# Patient Record
Sex: Female | Born: 1965 | Hispanic: No | Marital: Married | Smoking: Former smoker
Health system: Southern US, Community
[De-identification: ages and names within clinical notes are randomized; demographics above are authoritative.]

## PROBLEM LIST (undated history)

## (undated) DIAGNOSIS — F32A Depression, unspecified: Secondary | ICD-10-CM

## (undated) DIAGNOSIS — E039 Hypothyroidism, unspecified: Secondary | ICD-10-CM

## (undated) DIAGNOSIS — F329 Major depressive disorder, single episode, unspecified: Secondary | ICD-10-CM

## (undated) DIAGNOSIS — F419 Anxiety disorder, unspecified: Secondary | ICD-10-CM

## (undated) HISTORY — PX: TUBAL LIGATION: SHX77

## (undated) HISTORY — PX: OTHER SURGICAL HISTORY: SHX169

## (undated) HISTORY — PX: EYE SURGERY: SHX253

## (undated) HISTORY — PX: TONSILLECTOMY: SUR1361

---

## 1999-06-26 ENCOUNTER — Other Ambulatory Visit: Admission: RE | Admit: 1999-06-26 | Discharge: 1999-06-26 | Payer: Self-pay | Admitting: General Practice

## 2001-02-03 ENCOUNTER — Other Ambulatory Visit: Admission: RE | Admit: 2001-02-03 | Discharge: 2001-02-03 | Payer: Self-pay | Admitting: Gynecology

## 2013-05-16 DEATH — deceased

## 2018-01-15 ENCOUNTER — Other Ambulatory Visit: Payer: Self-pay | Admitting: Neurosurgery

## 2018-01-22 NOTE — Pre-Procedure Instructions (Signed)
[place] M[place]  01/22/2018      [id number] - [place], [place] - [address] AT [facility] [address] [place] [phone] Phone: [phone] Fax: [phone]    Your procedure is scheduled on 03/04/18.  [facility] Ad[facility] at 1030 A.M.  Call this number if you have problems the morning of surgery:  [phone]   Remember:  Do not eat or drink after midnight.      Take these medicines the morning of surgery with A SIP OF WATER --synthroid,claritin    Do not wear jewelry, make-up or nail polish.  Do not wear lotions, powders, or perfumes, or deodorant.  Do not shave 48 hours prior to surgery.  Men may shave face and neck.  Do not bring valuables to the hospital.  [facility] is not responsible for any belongings or valuables.  Contacts, dentures or bridgework may not be worn into surgery.  Leave your suitcase in the car.  After surgery it may be brought to your room.  For patients admitted to the hospital, discharge time will be determined by your treatment team.  Patients discharged the day of surgery will not be allowed to drive home.   Name and phone number of your driver:  Do not take any aspirin,anti-inflammatories,vitamins,or herbal supplements 5-7 days prior to surgery. Special instructions:  [place] - Preparing for Surgery  Before surgery, you can play an important role.  Because skin is not sterile, your skin needs to be as free of germs as possible.  You can reduce the number of germs on you skin by washing with CHG (chlorahexidine gluconate) soap before surgery.  CHG is an antiseptic cleaner which kills germs and bonds with the skin to continue killing germs even after washing.  Oral Hygiene is also important in reducing the risk of infection.  Remember to brush your teeth with your regular toothpaste the morning of surgery.  Please DO NOT use if you have an allergy to CHG or antibacterial  soaps.  If your skin becomes reddened/irritated stop using the CHG and inform your nurse when you arrive at Short Stay.  Do not shave (including legs and underarms) for at least 48 hours prior to the first CHG shower.  You may shave your face.  Please follow these instructions carefully:   1.  Shower with CHG Soap the night before surgery and the morning of Surgery.  2.  If you choose to wash your hair, wash your hair first as usual with your normal shampoo.  3.  After you shampoo, rinse your hair and body thoroughly to remove the shampoo. 4.  Use CHG as you would any other liquid soap.  You can apply chg directly to the skin and wash gently with a      scrungie or washcloth.           5.  Apply the CHG Soap to your body ONLY FROM THE NECK DOWN.   Do not use on open wounds or open sores. Avoid contact with your eyes, ears, mouth and genitals (private parts).  Wash genitals (private parts) with your normal soap.  6.  Wash thoroughly, paying special attention to the area where your surgery will be performed.  7.  Thoroughly rinse your body with warm water from the neck down.  8.  DO NOT shower/wash with your normal soap after using and rinsing off the CHG  Soap.  9.  Pat yourself dry with a clean towel.            10.  Wear clean pajamas.            11.  Place clean sheets on your bed the night of your first shower and do not sleep with pets.  Day of Surgery  Do not apply any lotions/deoderants the morning of surgery.   Please wear clean clothes to the hospital/surgery center. Remember to brush your teeth with toothpaste.    Please read over the following fact sheets that you were given.

## 2018-01-25 ENCOUNTER — Encounter (HOSPITAL_COMMUNITY)
Admission: RE | Admit: 2018-01-25 | Discharge: 2018-01-25 | Disposition: A | Discharge status: Home / Self Care | Payer: BLUE CROSS/BLUE SHIELD | Source: Ambulatory Visit | Attending: Neurosurgery | Admitting: Neurosurgery

## 2018-01-25 ENCOUNTER — Encounter (HOSPITAL_COMMUNITY): Payer: Self-pay | Admitting: *Deleted

## 2018-01-25 DIAGNOSIS — Z01812 Encounter for preprocedural laboratory examination: Secondary | ICD-10-CM | POA: Diagnosis not present

## 2018-01-25 HISTORY — DX: Depression, unspecified: F32.A

## 2018-01-25 HISTORY — DX: Major depressive disorder, single episode, unspecified: F32.9

## 2018-01-25 HISTORY — DX: Hypothyroidism, unspecified: E03.9

## 2018-01-25 HISTORY — DX: Anxiety disorder, unspecified: F41.9

## 2018-01-25 LAB — TYPE AND SCREEN
ABO/RH(D): O POS
Antibody Screen: NEGATIVE

## 2018-01-25 LAB — BASIC METABOLIC PANEL
ANION GAP: 11 (ref 5–15)
BUN: 14 mg/dL (ref 6–20)
CHLORIDE: 103 mmol/L (ref 98–111)
CO2: 22 mmol/L (ref 22–32)
Calcium: 9.4 mg/dL (ref 8.9–10.3)
Creatinine, Ser: 0.62 mg/dL (ref 0.44–1.00)
GFR calc Af Amer: >60 mL/min (ref >60)
GFR calc non Af Amer: >60 mL/min (ref >60)
Glucose, Bld: 93 mg/dL (ref 70–99)
POTASSIUM: 4.3 mmol/L (ref 3.5–5.1)
Sodium: 136 mmol/L (ref 135–145)

## 2018-01-25 LAB — SURGICAL PCR SCREEN
MRSA, PCR: NEGATIVE
Staphylococcus aureus: NEGATIVE

## 2018-01-25 LAB — CBC
HCT: 39.9 % (ref 36.0–46.0)
HEMOGLOBIN: 13.2 g/dL (ref 12.0–15.0)
MCH: 30.1 pg (ref 26.0–34.0)
MCHC: 33.1 g/dL (ref 30.0–36.0)
MCV: 90.9 fL (ref 78.0–100.0)
PLATELETS: 226 10*3/uL (ref 150–400)
RBC: 4.39 MIL/uL (ref 3.87–5.11)
RDW: 13.3 % (ref 11.5–15.5)
WBC: 8.9 10*3/uL (ref 4.0–10.5)

## 2018-01-25 LAB — ABO/RH: ABO/RH(D): O POS

## 2018-01-25 MED ORDER — CHLORHEXIDINE GLUCONATE CLOTH 2 % EX PADS: 6.0000 | MEDICATED_PAD | Freq: Once | CUTANEOUS | Status: DC

## 2018-02-01 ENCOUNTER — Inpatient Hospital Stay (HOSPITAL_COMMUNITY): Payer: BLUE CROSS/BLUE SHIELD | Admitting: Anesthesiology

## 2018-02-01 ENCOUNTER — Inpatient Hospital Stay (HOSPITAL_COMMUNITY): Payer: BLUE CROSS/BLUE SHIELD

## 2018-02-01 ENCOUNTER — Encounter (HOSPITAL_COMMUNITY): Payer: Self-pay | Admitting: Certified Registered Nurse Anesthetist

## 2018-02-01 ENCOUNTER — Other Ambulatory Visit: Payer: Self-pay

## 2018-02-01 ENCOUNTER — Inpatient Hospital Stay (HOSPITAL_COMMUNITY)
Admission: RE | Disposition: A | Discharge status: Home / Self Care | Payer: Self-pay | Source: Ambulatory Visit | Attending: Neurosurgery

## 2018-02-01 ENCOUNTER — Inpatient Hospital Stay (HOSPITAL_COMMUNITY)
Admission: RE | Admit: 2018-02-01 | Discharge: 2018-02-02 | DRG: 455 | Disposition: A | Discharge status: Home / Self Care | Payer: BLUE CROSS/BLUE SHIELD | Source: Ambulatory Visit | Attending: Neurosurgery | Admitting: Neurosurgery

## 2018-02-01 ENCOUNTER — Other Ambulatory Visit: Payer: Self-pay | Admitting: Neurosurgery

## 2018-02-01 DIAGNOSIS — Z79899 Other long term (current) drug therapy: Secondary | ICD-10-CM | POA: Diagnosis not present

## 2018-02-01 DIAGNOSIS — F329 Major depressive disorder, single episode, unspecified: Secondary | ICD-10-CM | POA: Diagnosis present

## 2018-02-01 DIAGNOSIS — M48062 Spinal stenosis, lumbar region with neurogenic claudication: Secondary | ICD-10-CM | POA: Diagnosis present

## 2018-02-01 DIAGNOSIS — M4316 Spondylolisthesis, lumbar region: Secondary | ICD-10-CM | POA: Diagnosis present

## 2018-02-01 DIAGNOSIS — Z419 Encounter for procedure for purposes other than remedying health state, unspecified: Secondary | ICD-10-CM

## 2018-02-01 DIAGNOSIS — Z87891 Personal history of nicotine dependence: Secondary | ICD-10-CM

## 2018-02-01 DIAGNOSIS — M47816 Spondylosis without myelopathy or radiculopathy, lumbar region: Secondary | ICD-10-CM | POA: Diagnosis present

## 2018-02-01 DIAGNOSIS — M5136 Other intervertebral disc degeneration, lumbar region: Secondary | ICD-10-CM | POA: Diagnosis present

## 2018-02-01 DIAGNOSIS — F419 Anxiety disorder, unspecified: Secondary | ICD-10-CM | POA: Diagnosis present

## 2018-02-01 DIAGNOSIS — M7130 Other bursal cyst, unspecified site: Secondary | ICD-10-CM | POA: Diagnosis present

## 2018-02-01 DIAGNOSIS — E039 Hypothyroidism, unspecified: Secondary | ICD-10-CM | POA: Diagnosis present

## 2018-02-01 SURGERY — POSTERIOR LUMBAR FUSION 1 LEVEL
Anesthesia: General | Site: Back

## 2018-02-01 MED ORDER — MIDAZOLAM HCL 2 MG/2ML IJ SOLN
INTRAMUSCULAR | Status: AC
  Filled 2018-02-01: qty 2

## 2018-02-01 MED ORDER — BUPIVACAINE HCL (PF) 0.5 % IJ SOLN
INTRAMUSCULAR | Status: AC
  Filled 2018-02-01: qty 30

## 2018-02-01 MED ORDER — ONDANSETRON HCL 4 MG/2ML IJ SOLN
INTRAMUSCULAR | Status: AC
  Filled 2018-02-01: qty 2

## 2018-02-01 MED ORDER — PAROXETINE HCL 20 MG PO TABS
20.0000 mg | ORAL_TABLET | Freq: Every evening | ORAL | Status: DC
  Filled 2018-02-01 (×3): qty 1

## 2018-02-01 MED ORDER — CEFAZOLIN SODIUM-DEXTROSE 2-4 GM/100ML-% IV SOLN
INTRAVENOUS | Status: AC
  Filled 2018-02-01: qty 100

## 2018-02-01 MED ORDER — THROMBIN (RECOMBINANT) 20000 UNITS EX SOLR
CUTANEOUS | Status: AC
  Filled 2018-02-01: qty 20000

## 2018-02-01 MED ORDER — HYDROMORPHONE HCL 1 MG/ML IJ SOLN
0.2500 mg | INTRAMUSCULAR | Status: DC | PRN
  Administered 2018-02-01: 0.5 mg via INTRAVENOUS

## 2018-02-01 MED ORDER — HYDROCODONE-ACETAMINOPHEN 5-325 MG PO TABS
1.0000 | ORAL_TABLET | ORAL | Status: DC | PRN
  Administered 2018-02-01: 2 via ORAL
  Administered 2018-02-02 (×2): 1 via ORAL
  Filled 2018-02-01: qty 1
  Filled 2018-02-01: qty 2
  Filled 2018-02-01: qty 1

## 2018-02-01 MED ORDER — FLEET ENEMA 7-19 GM/118ML RE ENEM: 1.0000 | ENEMA | Freq: Once | RECTAL | Status: DC | PRN

## 2018-02-01 MED ORDER — CEFAZOLIN SODIUM-DEXTROSE 2-4 GM/100ML-% IV SOLN
2.0000 g | INTRAVENOUS | Status: AC
  Administered 2018-02-01 (×2): 2 g via INTRAVENOUS

## 2018-02-01 MED ORDER — SODIUM CHLORIDE 0.9 % IV SOLN
INTRAVENOUS | Status: DC | PRN
  Administered 2018-02-01: 500 mL

## 2018-02-01 MED ORDER — LEVOTHYROXINE SODIUM 50 MCG PO TABS
50.0000 ug | ORAL_TABLET | Freq: Every day | ORAL | Status: DC
  Filled 2018-02-01: qty 1

## 2018-02-01 MED ORDER — KETOROLAC TROMETHAMINE 30 MG/ML IJ SOLN
30.0000 mg | Freq: Once | INTRAMUSCULAR | Status: AC
Start: 2018-02-01 — End: 2018-02-01
  Administered 2018-02-01: 30 mg via INTRAVENOUS

## 2018-02-01 MED ORDER — ONDANSETRON HCL 4 MG/2ML IJ SOLN: 4.0000 mg | Freq: Once | INTRAMUSCULAR | Status: DC | PRN

## 2018-02-01 MED ORDER — DEXAMETHASONE SODIUM PHOSPHATE 10 MG/ML IJ SOLN
INTRAMUSCULAR | Status: AC
  Filled 2018-02-01: qty 1

## 2018-02-01 MED ORDER — THROMBIN 5000 UNITS EX SOLR
CUTANEOUS | Status: AC
  Filled 2018-02-01: qty 5000

## 2018-02-01 MED ORDER — MEPERIDINE HCL 50 MG/ML IJ SOLN: 6.2500 mg | INTRAMUSCULAR | Status: DC | PRN

## 2018-02-01 MED ORDER — ROCURONIUM BROMIDE 10 MG/ML (PF) SYRINGE
PREFILLED_SYRINGE | INTRAVENOUS | Status: DC | PRN
  Administered 2018-02-01: 10 mg via INTRAVENOUS
  Administered 2018-02-01: 20 mg via INTRAVENOUS
  Administered 2018-02-01: 50 mg via INTRAVENOUS
  Administered 2018-02-01: 20 mg via INTRAVENOUS

## 2018-02-01 MED ORDER — FENTANYL CITRATE (PF) 250 MCG/5ML IJ SOLN
INTRAMUSCULAR | Status: AC
  Filled 2018-02-01: qty 5

## 2018-02-01 MED ORDER — MIDAZOLAM HCL 2 MG/2ML IJ SOLN
INTRAMUSCULAR | Status: DC | PRN
  Administered 2018-02-01: 2 mg via INTRAVENOUS

## 2018-02-01 MED ORDER — MORPHINE SULFATE (PF) 4 MG/ML IV SOLN: 4.0000 mg | INTRAVENOUS | Status: DC | PRN

## 2018-02-01 MED ORDER — KETOROLAC TROMETHAMINE 30 MG/ML IJ SOLN
INTRAMUSCULAR | Status: AC
  Filled 2018-02-01: qty 1

## 2018-02-01 MED ORDER — ARTIFICIAL TEARS OPHTHALMIC OINT
TOPICAL_OINTMENT | OPHTHALMIC | Status: AC
  Filled 2018-02-01: qty 3.5

## 2018-02-01 MED ORDER — ALUM & MAG HYDROXIDE-SIMETH 200-200-20 MG/5ML PO SUSP: 30.0000 mL | Freq: Four times a day (QID) | ORAL | Status: DC | PRN

## 2018-02-01 MED ORDER — ACETAMINOPHEN 325 MG PO TABS: 650.0000 mg | ORAL_TABLET | ORAL | Status: DC | PRN

## 2018-02-01 MED ORDER — ACETAMINOPHEN 650 MG RE SUPP: 650.0000 mg | RECTAL | Status: DC | PRN

## 2018-02-01 MED ORDER — ACETAMINOPHEN 10 MG/ML IV SOLN
INTRAVENOUS | Status: DC | PRN
  Administered 2018-02-01: 1000 mg via INTRAVENOUS

## 2018-02-01 MED ORDER — MENTHOL 3 MG MT LOZG: 1.0000 | LOZENGE | OROMUCOSAL | Status: DC | PRN

## 2018-02-01 MED ORDER — THROMBIN 5000 UNITS EX SOLR
OROMUCOSAL | Status: DC | PRN
  Administered 2018-02-01: 5 mL via TOPICAL

## 2018-02-01 MED ORDER — FENTANYL CITRATE (PF) 250 MCG/5ML IJ SOLN
INTRAMUSCULAR | Status: DC | PRN
  Administered 2018-02-01: 50 ug via INTRAVENOUS
  Administered 2018-02-01: 150 ug via INTRAVENOUS

## 2018-02-01 MED ORDER — ARTIFICIAL TEARS OPHTHALMIC OINT
TOPICAL_OINTMENT | OPHTHALMIC | Status: DC | PRN
  Administered 2018-02-01: 1 via OPHTHALMIC

## 2018-02-01 MED ORDER — SODIUM CHLORIDE 0.9 % IV SOLN: 250.0000 mL | INTRAVENOUS | Status: DC

## 2018-02-01 MED ORDER — GLYCOPYRROLATE PF 0.2 MG/ML IJ SOSY
PREFILLED_SYRINGE | INTRAMUSCULAR | Status: DC | PRN
  Administered 2018-02-01: .2 mg via INTRAVENOUS

## 2018-02-01 MED ORDER — DEXAMETHASONE SODIUM PHOSPHATE 10 MG/ML IJ SOLN
INTRAMUSCULAR | Status: DC | PRN
  Administered 2018-02-01: 10 mg via INTRAVENOUS

## 2018-02-01 MED ORDER — LACTATED RINGERS IV SOLN
INTRAVENOUS | Status: DC
  Administered 2018-02-01 (×3): via INTRAVENOUS

## 2018-02-01 MED ORDER — MAGNESIUM HYDROXIDE 400 MG/5ML PO SUSP: 30.0000 mL | Freq: Every day | ORAL | Status: DC | PRN

## 2018-02-01 MED ORDER — HYDROXYZINE HCL 50 MG/ML IM SOLN: 50.0000 mg | INTRAMUSCULAR | Status: DC | PRN

## 2018-02-01 MED ORDER — LIDOCAINE-EPINEPHRINE 1 %-1:100000 IJ SOLN
INTRAMUSCULAR | Status: AC
  Filled 2018-02-01: qty 1

## 2018-02-01 MED ORDER — LIDOCAINE 2% (20 MG/ML) 5 ML SYRINGE
INTRAMUSCULAR | Status: AC
  Filled 2018-02-01: qty 5

## 2018-02-01 MED ORDER — KETOROLAC TROMETHAMINE 30 MG/ML IJ SOLN
30.0000 mg | Freq: Four times a day (QID) | INTRAMUSCULAR | Status: DC
  Administered 2018-02-01 – 2018-02-02 (×2): 30 mg via INTRAVENOUS
  Filled 2018-02-01 (×2): qty 1

## 2018-02-01 MED ORDER — KCL IN DEXTROSE-NACL 20-5-0.45 MEQ/L-%-% IV SOLN: INTRAVENOUS | Status: DC

## 2018-02-01 MED ORDER — LORATADINE 10 MG PO TABS: 10.0000 mg | ORAL_TABLET | Freq: Every day | ORAL | Status: DC

## 2018-02-01 MED ORDER — SODIUM CHLORIDE 0.9% FLUSH: 3.0000 mL | INTRAVENOUS | Status: DC | PRN

## 2018-02-01 MED ORDER — PHENOL 1.4 % MT LIQD: 1.0000 | OROMUCOSAL | Status: DC | PRN

## 2018-02-01 MED ORDER — SUGAMMADEX SODIUM 200 MG/2ML IV SOLN
INTRAVENOUS | Status: DC | PRN
  Administered 2018-02-01: 200 mg via INTRAVENOUS

## 2018-02-01 MED ORDER — 0.9 % SODIUM CHLORIDE (POUR BTL) OPTIME
TOPICAL | Status: DC | PRN
  Administered 2018-02-01: 1000 mL

## 2018-02-01 MED ORDER — BISACODYL 10 MG RE SUPP: 10.0000 mg | Freq: Every day | RECTAL | Status: DC | PRN

## 2018-02-01 MED ORDER — HYDROMORPHONE HCL 1 MG/ML IJ SOLN
INTRAMUSCULAR | Status: AC
  Filled 2018-02-01: qty 1

## 2018-02-01 MED ORDER — PROPOFOL 10 MG/ML IV BOLUS
INTRAVENOUS | Status: DC | PRN
  Administered 2018-02-01: 120 mg via INTRAVENOUS

## 2018-02-01 MED ORDER — ONDANSETRON HCL 4 MG/2ML IJ SOLN
INTRAMUSCULAR | Status: DC | PRN
  Administered 2018-02-01: 4 mg via INTRAVENOUS

## 2018-02-01 MED ORDER — BUPIVACAINE HCL (PF) 0.5 % IJ SOLN
INTRAMUSCULAR | Status: DC | PRN
  Administered 2018-02-01: 15 mL

## 2018-02-01 MED ORDER — LIDOCAINE 2% (20 MG/ML) 5 ML SYRINGE
INTRAMUSCULAR | Status: DC | PRN
  Administered 2018-02-01: 60 mg via INTRAVENOUS

## 2018-02-01 MED ORDER — ACETAMINOPHEN 10 MG/ML IV SOLN
INTRAVENOUS | Status: AC
  Filled 2018-02-01: qty 100

## 2018-02-01 MED ORDER — ROCURONIUM BROMIDE 50 MG/5ML IV SOSY
PREFILLED_SYRINGE | INTRAVENOUS | Status: AC
  Filled 2018-02-01: qty 10

## 2018-02-01 MED ORDER — HYDROXYZINE HCL 25 MG PO TABS: 50.0000 mg | ORAL_TABLET | ORAL | Status: DC | PRN

## 2018-02-01 MED ORDER — THROMBIN 20000 UNITS EX SOLR
CUTANEOUS | Status: DC | PRN
  Administered 2018-02-01: 20 mL via TOPICAL

## 2018-02-01 MED ORDER — LIDOCAINE-EPINEPHRINE 1 %-1:100000 IJ SOLN
INTRAMUSCULAR | Status: DC | PRN
  Administered 2018-02-01: 15 mL

## 2018-02-01 MED ORDER — SODIUM CHLORIDE 0.9% FLUSH
3.0000 mL | Freq: Two times a day (BID) | INTRAVENOUS | Status: DC
  Administered 2018-02-01: 3 mL via INTRAVENOUS

## 2018-02-01 MED ORDER — PROPOFOL 10 MG/ML IV BOLUS
INTRAVENOUS | Status: AC
  Filled 2018-02-01: qty 20

## 2018-02-01 MED ORDER — EPHEDRINE SULFATE-NACL 50-0.9 MG/10ML-% IV SOSY
PREFILLED_SYRINGE | INTRAVENOUS | Status: DC | PRN
  Administered 2018-02-01 (×2): 10 mg via INTRAVENOUS

## 2018-02-01 MED ORDER — CYCLOBENZAPRINE HCL 5 MG PO TABS: 5.0000 mg | ORAL_TABLET | Freq: Three times a day (TID) | ORAL | Status: DC | PRN

## 2018-02-01 SURGICAL SUPPLY — 74 items
ADH SKN CLS APL DERMABOND .7 (GAUZE/BANDAGES/DRESSINGS) ×1
APL SKNCLS STERI-STRIP NONHPOA (GAUZE/BANDAGES/DRESSINGS)
BAG DECANTER FOR FLEXI CONT (MISCELLANEOUS) ×2 IMPLANT
BENZOIN TINCTURE PRP APPL 2/3 (GAUZE/BANDAGES/DRESSINGS) IMPLANT
BLADE CLIPPER SURG (BLADE) IMPLANT
BUR ACRON 5.0MM COATED (BURR) ×2 IMPLANT
BUR MATCHSTICK NEURO 3.0 LAGG (BURR) ×2 IMPLANT
CANISTER SUCT 3000ML PPV (MISCELLANEOUS) ×2 IMPLANT
CAP LCK SPNE (Orthopedic Implant) ×4 IMPLANT
CAP LOCK SPINE RADIUS (Orthopedic Implant) ×4 IMPLANT
CAP LOCKING (Orthopedic Implant) ×8 IMPLANT
CARTRIDGE OIL MAESTRO DRILL (MISCELLANEOUS) ×1 IMPLANT
CONT SPEC 4OZ CLIKSEAL STRL BL (MISCELLANEOUS) ×2 IMPLANT
COVER BACK TABLE 60X90IN (DRAPES) ×2 IMPLANT
DECANTER SPIKE VIAL GLASS SM (MISCELLANEOUS) ×2 IMPLANT
DERMABOND ADVANCED (GAUZE/BANDAGES/DRESSINGS) ×1
DERMABOND ADVANCED .7 DNX12 (GAUZE/BANDAGES/DRESSINGS) ×1 IMPLANT
DIFFUSER DRILL AIR PNEUMATIC (MISCELLANEOUS) ×2 IMPLANT
DRAPE C-ARM 42X72 X-RAY (DRAPES) ×4 IMPLANT
DRAPE C-ARMOR (DRAPES) IMPLANT
DRAPE HALF SHEET 40X57 (DRAPES) ×2 IMPLANT
DRAPE LAPAROTOMY 100X72X124 (DRAPES) ×2 IMPLANT
DRAPE POUCH INSTRU U-SHP 10X18 (DRAPES) ×2 IMPLANT
DRAPE PROXIMA HALF (DRAPES) ×2 IMPLANT
ELECT REM PT RETURN 9FT ADLT (ELECTROSURGICAL) ×2
ELECTRODE REM PT RTRN 9FT ADLT (ELECTROSURGICAL) ×1 IMPLANT
GAUZE 4X4 16PLY RFD (DISPOSABLE) IMPLANT
GAUZE SPONGE 4X4 12PLY STRL (GAUZE/BANDAGES/DRESSINGS) ×2 IMPLANT
GLOVE BIOGEL PI IND STRL 7.0 (GLOVE) ×3 IMPLANT
GLOVE BIOGEL PI IND STRL 7.5 (GLOVE) ×2 IMPLANT
GLOVE BIOGEL PI IND STRL 8 (GLOVE) ×2 IMPLANT
GLOVE BIOGEL PI INDICATOR 7.0 (GLOVE) ×3
GLOVE BIOGEL PI INDICATOR 7.5 (GLOVE) ×2
GLOVE BIOGEL PI INDICATOR 8 (GLOVE) ×2
GLOVE ECLIPSE 7.5 STRL STRAW (GLOVE) ×4 IMPLANT
GLOVE SURG SS PI 7.5 STRL IVOR (GLOVE) ×10 IMPLANT
GOWN STRL REUS W/ TWL LRG LVL3 (GOWN DISPOSABLE) ×2 IMPLANT
GOWN STRL REUS W/ TWL XL LVL3 (GOWN DISPOSABLE) ×3 IMPLANT
GOWN STRL REUS W/TWL 2XL LVL3 (GOWN DISPOSABLE) IMPLANT
GOWN STRL REUS W/TWL LRG LVL3 (GOWN DISPOSABLE) ×4
GOWN STRL REUS W/TWL XL LVL3 (GOWN DISPOSABLE) ×6
HEMOSTAT POWDER KIT SURGIFOAM (HEMOSTASIS) ×2 IMPLANT
INTERBDY W/EXPND 11X24X7-10MM (Cage) ×4 IMPLANT
INTERBODY W/EXPND 11X24X7-10MM (Cage) ×2 IMPLANT
KIT BASIN OR (CUSTOM PROCEDURE TRAY) ×2 IMPLANT
KIT INFUSE X SMALL 1.4CC (Orthopedic Implant) ×2 IMPLANT
KIT TURNOVER KIT B (KITS) ×2 IMPLANT
NEEDLE ASP BONE MRW 8GX15 (NEEDLE) ×2 IMPLANT
NEEDLE SPNL 18GX3.5 QUINCKE PK (NEEDLE) ×4 IMPLANT
NEEDLE SPNL 22GX3.5 QUINCKE BK (NEEDLE) ×2 IMPLANT
NS IRRIG 1000ML POUR BTL (IV SOLUTION) ×2 IMPLANT
OIL CARTRIDGE MAESTRO DRILL (MISCELLANEOUS) ×2
PACK LAMINECTOMY NEURO (CUSTOM PROCEDURE TRAY) ×2 IMPLANT
PAD ARMBOARD 7.5X6 YLW CONV (MISCELLANEOUS) ×6 IMPLANT
PATTIES SURGICAL .5 X.5 (GAUZE/BANDAGES/DRESSINGS) IMPLANT
PATTIES SURGICAL .5 X1 (DISPOSABLE) IMPLANT
PATTIES SURGICAL 1X1 (DISPOSABLE) IMPLANT
ROD RADIUS 35MM (Rod) ×4 IMPLANT
SCREW 5.75X40M (Screw) ×8 IMPLANT
SPONGE LAP 4X18 RFD (DISPOSABLE) IMPLANT
SPONGE NEURO XRAY DETECT 1X3 (DISPOSABLE) IMPLANT
SPONGE SURGIFOAM ABS GEL 100 (HEMOSTASIS) ×2 IMPLANT
STRIP BIOACTIVE VITOSS 25X100X (Neuro Prosthesis/Implant) ×2 IMPLANT
STRIP BIOACTIVE VITOSS 25X52X4 (Orthopedic Implant) ×2 IMPLANT
SUT VIC AB 1 CT1 18XBRD ANBCTR (SUTURE) ×2 IMPLANT
SUT VIC AB 1 CT1 8-18 (SUTURE) ×4
SUT VIC AB 2-0 CP2 18 (SUTURE) ×4 IMPLANT
SYR 3ML LL SCALE MARK (SYRINGE) IMPLANT
SYR CONTROL 10ML LL (SYRINGE) ×2 IMPLANT
TAPE CLOTH SURG 4X10 WHT LF (GAUZE/BANDAGES/DRESSINGS) ×2 IMPLANT
TOWEL GREEN STERILE (TOWEL DISPOSABLE) ×2 IMPLANT
TOWEL GREEN STERILE FF (TOWEL DISPOSABLE) ×2 IMPLANT
TRAY FOLEY MTR SLVR 16FR STAT (SET/KITS/TRAYS/PACK) ×2 IMPLANT
WATER STERILE IRR 1000ML POUR (IV SOLUTION) ×2 IMPLANT

## 2018-02-01 NOTE — H&P (Signed)
Subjective: Patient is a 52 y.o. right-handed white female who is admitted for treatment of bar stenosis with neurogenic claudication and weakness in the distal lower extremities, right worse than left.  Patient developed difficulties earlier this year, that it progressively worsened.  She is been having pain across the low back and down to the right lower extremity.  [place] is been treated with numerous medications and was found to have weakness in the distal lower extremity.  X-ray showed sacralization of L5, with a grade 1 dynamic spondylolisthesis of L3-4 worse than L4-5.  At L3-4 there is severe multifactorial lumbar stenosis, right worse than left, contributed to by hypertrophic facet arthropathy, ligament of flavum thickening, bilateral synovial cyst impression right worse than left.  No stenosis is seen at the L4-5 level.  Patient is admitted now for a L3-4 lumbar decompression including laminectomy, facetectomy, and foraminotomy and stabilization via posterior lumbar interbody arthrodesis with interbody implants and bone graft and posterior lateral arthrodesis with posterior instrumentation and bone graft.   Past Medical History:  Diagnosis Date  . Anxiety   . Depression   . Hypothyroidism     Past Surgical History:  Procedure Laterality Date  . EYE SURGERY    . toe broken     lt wrist broken  . TONSILLECTOMY    . TUBAL LIGATION      Medications Prior to Admission  Medication Sig Dispense Refill Last Dose  . Bioflavonoid Products (GRAPE SEED PO) Take 1 capsule by mouth 2 (two) times daily.   Past Week at Unknown time  . levothyroxine (SYNTHROID, LEVOTHROID) 50 MCG tablet Take 50 mcg by mouth daily before breakfast.   02/01/2018 at Unknown time  . loratadine (CLARITIN) 10 MG tablet Take 10 mg by mouth daily.   02/01/2018 at Unknown time  . Misc Natural Products (GLUCOSAMINE CHONDROITIN TRIPLE) TABS Take 1 tablet by mouth 2 (two) times daily.    Past Week at Unknown time  . Multiple Vitamin (MULTIVITAMIN WITH MINERALS) TABS tablet Take 1 tablet by mouth daily.   Past Week at Unknown time  . naproxen (NAPROSYN) 500 MG tablet Take 500 mg by mouth 2 (two) times daily.   Past Week at Unknown time  . niacin 500 MG tablet Take 500 mg by mouth daily.   Past Week at Unknown time  . Omega-3 Fatty Acids (FISH OIL PO) Take 1 capsule by mouth daily.   Past Week at Unknown time  . PARoxetine (PAXIL) 20 MG tablet Take 20 mg by mouth every evening.   01/31/2018 at Unknown time  . Polyethylene Glycol 400 (BLINK TEARS) 0.25 % GEL Place 1 drop into both eyes at bedtime.   Past Week at Unknown time  . Specialty Vitamins Products (BIOTIN PLUS KERATIN) 10000-100 MCG-MG TABS Take 1 tablet by mouth daily.   Past Week at Unknown time  . TURMERIC PO Take 1 tablet by mouth 2 (two) times daily.   Past Week at Unknown time   No Known Allergies  Social History   Tobacco Use  . Smoking status: Former Smoker    Last attempt to quit: 01/26/2011    Years since quitting: 7.0  Substance Use Topics  . Alcohol use: Not on file    Comment: occ    History reviewed. No pertinent family history.   Review of Systems Pertinent items noted in HPI and remainder of comprehensive ROS otherwise negative.  Objective: Vital signs in last 24 hours:  Temp:  [98.1 F (36.7 C)] 98.1 F (36.7 C) (08/19 1105) Pulse Rate:  [53] 53 (08/19 1105) Resp:  [18] 18 (08/19 1105) BP: (98)/(58) 98/58 (08/19 1105) SpO2:  [96 %] 96 % (08/19 1105) Weight:  [64.6 kg] 64.6 kg (08/19 1044)  EXAM: Patient is a well-developed well-nourished white female in no acute distress. Lungs are clear to auscultation , the patient has symmetrical respiratory excursion. Heart has a regular rate and rhythm normal S1 and S2 no murmur.   Abdomen is soft nontender nondistended bowel sounds are present. Extremity examination shows no clubbing cyanosis or edema. Neurologic examination shows the iliopsoas and  quadriceps are 5 bilaterally.  Left dorsiflexor is 5, right dorsiflexor is 4.  EHL is 4 bilaterally.  Plantar flexor 5 body.  Sensation is decreased to pinprick in the medial aspect of the right foot.  Reflexes are 1 at the quadriceps, absent at the gastrocnemius, symmetrical belly.  Toes are downgoing belly.  She has a normal gait and stance.  Data Review:CBC    Component Value Date/Time   WBC 8.9 01/25/2018 1443   RBC 4.39 01/25/2018 1443   HGB 13.2 01/25/2018 1443   HCT 39.9 01/25/2018 1443   PLT 226 01/25/2018 1443   MCV 90.9 01/25/2018 1443   MCH 30.1 01/25/2018 1443   MCHC 33.1 01/25/2018 1443   RDW 13.3 01/25/2018 1443                          BMET    Component Value Date/Time   NA 136 01/25/2018 1443   K 4.3 01/25/2018 1443   CL 103 01/25/2018 1443   CO2 22 01/25/2018 1443   GLUCOSE 93 01/25/2018 1443   BUN 14 01/25/2018 1443   CREATININE 0.62 01/25/2018 1443   CALCIUM 9.4 01/25/2018 1443   GFRNONAA >60 01/25/2018 1443   GFRAA >60 01/25/2018 1443     Assessment/Plan: Patient with low back pain and neurogenic claudication with worse pain to the right lower extremity and with weakness bilaterally again worse on the right side.  She has severe multifactorial lumbar stenosis at the L3-4 level and is admitted now for decompression and stabilization.  I've discussed with the patient the nature of his condition, the nature the surgical procedure, the typical length of surgery, hospital stay, and overall recuperation, the limitations postoperatively, and risks of surgery. I discussed risks including risks of infection, bleeding, possibly need for transfusion, the risk of nerve root dysfunction with pain, weakness, numbness, or paresthesias, the risk of dural tear and CSF leakage and possible need for further surgery, the risk of failure of the arthrodesis and possibly for further surgery, the risk of anesthetic complications including myocardial infarction, stroke, pneumonia, and  death. We discussed the need for postoperative immobilization in a lumbar brace. Understanding all this the patient does wish to proceed with surgery and is admitted for such.   Hewitt ShortsNUDELMAN,ROBERT W, MD 02/01/2018 12:52 PM

## 2018-02-01 NOTE — Anesthesia Preprocedure Evaluation (Signed)
Anesthesia Evaluation  Patient identified by MRN, date of birth, ID band Patient awake    Reviewed: Allergy & Precautions, NPO status , Patient's Chart, lab work & pertinent test results  Airway Mallampati: I  TM Distance: >3 FB Neck ROM: Full    Dental   Pulmonary former smoker,    Pulmonary exam normal        Cardiovascular Normal cardiovascular exam     Neuro/Psych Anxiety Depression    GI/Hepatic   Endo/Other    Renal/GU      Musculoskeletal   Abdominal   Peds  Hematology   Anesthesia Other Findings   Reproductive/Obstetrics                             Anesthesia Physical Anesthesia Plan  ASA: III  Anesthesia Plan: General   Post-op Pain Management:    Induction: Intravenous  PONV Risk Score and Plan: 3 and Ondansetron, Midazolam and Dexamethasone  Airway Management Planned: Oral ETT  Additional Equipment:   Intra-op Plan:   Post-operative Plan: Extubation in OR  Informed Consent: I have reviewed the patients History and Physical, chart, labs and discussed the procedure including the risks, benefits and alternatives for the proposed anesthesia with the patient or authorized representative who has indicated his/her understanding and acceptance.     Plan Discussed with: CRNA and Surgeon  Anesthesia Plan Comments:         Anesthesia Quick Evaluation

## 2018-02-01 NOTE — Transfer of Care (Signed)
Immediate Anesthesia Transfer of Care Note  Patient: Megan Fox  Procedure(s) Performed: Lumbar three-Lumbar four decompression, Posterior Lumbar Interbody Fusion, Posterior Lateral Arthrodesis (N/A Back)  Patient Location: PACU  Anesthesia Type:General  Level of Consciousness: drowsy  Airway & Oxygen Therapy: Patient Spontanous Breathing and Patient connected to face mask oxygen  Post-op Assessment: Report given to RN and Post -op Vital signs reviewed and stable  Post vital signs: Reviewed and stable  Last Vitals:  Vitals Value Taken Time  BP 141/74 02/01/2018  5:02 PM  Temp    Pulse 87 02/01/2018  5:04 PM  Resp 19 02/01/2018  5:04 PM  SpO2 100 % 02/01/2018  5:04 PM  Vitals shown include unvalidated device data.  Last Pain:  Vitals:   02/01/18 1105  TempSrc: Oral  PainSc:          Complications: No apparent anesthesia complications

## 2018-02-01 NOTE — Anesthesia Postprocedure Evaluation (Signed)
Anesthesia Post Note  Patient: Megan Fox  Procedure(s) Performed: Lumbar three-Lumbar four decompression, Posterior Lumbar Interbody Fusion, Posterior Lateral Arthrodesis (N/A Back)     Patient location during evaluation: PACU Anesthesia Type: General Level of consciousness: awake Pain management: pain level controlled Vital Signs Assessment: post-procedure vital signs reviewed and stable Respiratory status: spontaneous breathing Cardiovascular status: stable Anesthetic complications: no    Last Vitals:  Vitals:   02/01/18 1702 02/01/18 1717  BP: (!) 141/74 120/61  Pulse: 78 68  Resp: 14 20  Temp: (!) 36.2 C   SpO2: 100% 100%    Last Pain:  Vitals:   02/01/18 1716  TempSrc:   PainSc: Asleep                 Laren Orama

## 2018-02-01 NOTE — Anesthesia Procedure Notes (Addendum)
Procedure Name: Intubation Date/Time: 02/01/2018 1:08 PM Performed by: Teressa Lower., CRNA Pre-anesthesia Checklist: Patient identified, Emergency Drugs available, Suction available and Patient being monitored Patient Re-evaluated:Patient Re-evaluated prior to induction Oxygen Delivery Method: Circle system utilized Preoxygenation: Pre-oxygenation with 100% oxygen Induction Type: IV induction Ventilation: Mask ventilation without difficulty Laryngoscope Size: Mac and 3 Grade View: Grade I Tube type: Oral Tube size: 7.0 mm Number of attempts: 1 Airway Equipment and Method: Stylet and Oral airway Placement Confirmation: ETT inserted through vocal cords under direct vision,  positive ETCO2 and breath sounds checked- equal and bilateral Secured at: 22 cm Tube secured with: Tape Dental Injury: Teeth and Oropharynx as per pre-operative assessment  Comments: Intubated by [facility]

## 2018-02-01 NOTE — Progress Notes (Signed)
Vitals:   02/01/18 1745 02/01/18 1800 02/01/18 1818 02/01/18 1945  BP: 108/64 111/62 127/66 114/62  Pulse: (!) 53 60 (!) 55 (!) 53  Resp: 11 14 16 18   Temp:  (!) 97.3 F (36.3 C)  97.7 F (36.5 C)  TempSrc:    Oral  SpO2: 94% 100% 100% 99%  Weight:      Height:        Patient up and ambulating in the halls.  Comfortable.  Dressing clean and dry.  Lower extremities feel much better than prior to surgery.  Foley DC'd, patient has voided since Foley came out.  Plan: Doing well following surgery.  Encouraged to continue to ambulate.  Continue to progress through postoperative recovery.  Hewitt ShortsNUDELMAN,ROBERT W, MD 02/01/2018, 8:16 PM

## 2018-02-01 NOTE — Op Note (Signed)
02/01/2018  4:54 PM  PATIENT:  Megan Fox  52 y.o. female  PRE-OPERATIVE DIAGNOSIS: Multifactorial L3-4 lumbar stenosis with neurogenic claudication and weakness in the distal lower extremities bilaterally; L3-4 grade 1 dynamic degenerative spondylolisthesis, bilateral L3-4 synovial cysts, lumbar spondylosis, lumbar degenerative disc disease  POST-OPERATIVE DIAGNOSIS:  Multifactorial L3-4 lumbar stenosis with neurogenic claudication and weakness in the distal lower extremities bilaterally; L3-4 grade 1 dynamic degenerative spondylolisthesis, bilateral L3-4 synovial cysts, lumbar spondylosis, lumbar degenerative disc disease  PROCEDURE:  Procedure(s): Bilateral L3-4 lumbar decompression including laminectomy, facetectomy, and foraminotomy, as well as resection of bilateral L3-4 synovial cysts, with decompression of central canal, lateral recess, and neuroforaminal stenosis with decompression of the exiting L3 and L4 nerve roots bilaterally, with decompression beyond that required for interbody arthrodesis; bilateral L3-4 posterior lumbar interbody arthrodesis with Mojave interbody implants, Vitoss BA with bone marrow aspirate, and infuse; bilateral L3-4 posterior lateral arthrodesis with nonsegmental radius posterior instrumentation, Vitoss BA with bone marrow aspirate, and infuse  SURGEON: Shirlean Kellyobert Nudelman, MD  ASSISTANTS: Donalee CitrinGary Cram, MD  ANESTHESIA:   general  EBL:  Total I/O In: 2090 [I.V.:2000; Blood:90] Out: 750 [Urine:450; Blood:300]  BLOOD ADMINISTERED:90 CC CELLSAVER  COUNT:  Correct nursing staff  DICTATION: Patient is brought to the operating room placed under general endotracheal anesthesia. The patient was turned to prone position the lumbar region was prepped with Betadine soap and solution and draped in a sterile fashion. The midline was infiltrated with local anesthesia with epinephrine. A localizing x-ray was taken and then a midline incision was made carried down through  the subcutaneous tissue, bipolar cautery and electrocautery were used to maintain hemostasis. Dissection was carried down to the lumbar fascia. The fascia was incised bilaterally and the paraspinal muscles were dissected with a spinous process and lamina in a subperiosteal fashion. Another x-ray was taken for localization and the L3-4 level was localized. Dissection was then carried out laterally over the facet complex and the transverse processes of L3 and L4 were exposed and decorticated.    Laminectomy was begun bilaterally at the L3-4 level using the high-speed drill and Kerrison punches. Dissection was carried out laterally including facetectomy and foraminotomies with decompression of the stenotic compression of the L3 and L4 nerve roots.  The ligament flavum was progressively mobilized, and synovial cyst were carefully freed from the thecal sac and mobilized and removed.  Once the decompression of the stenotic compression of the thecal sac and exiting nerve roots was completed we proceeded with the posterior lumbar interbody arthrodesis. The annulus was incised bilaterally and the disc space entered. A thorough discectomy was performed using pituitary rongeurs and curettes. Once the discectomy was completed we began to prepare the endplate surfaces removing the cartilaginous endplates surface. We then measured the height of the intervertebral disc space. We selected 11 x 24 x 7-10 Mojave interbody implants.  The C-arm fluoroscope was then draped and brought in the field and we identified the pedicle entry points bilaterally at the L3 and L4 levels. Each of the 4 pedicles was probed, we aspirated bone marrow aspirate from the vertebral bodies, this was injected over a 10 cc and a 5 cc strip of Vitoss BA. Then each of the pedicles was examined with the ball probe good bony surfaces were found and no bony cutouts were found. Each of the pedicles was then tapped with a 5.25 mm tap, again examined with the  ball probe good threading was found and no bony cutouts were found. We then placed  5.75 by 40 millimeter screws bilaterally at each level.  We then packed the lateral aspect of the disc space, bilaterally, with Vitoss BA with bone marrow aspirate, and then placed the first implant on the right side, carefully retracting the thecal sac and nerve root medially.  The implant was gradually expanded under C-arm fluoroscopic guidance initially lordotically, and then posteriorly.  We then went back to the left side and packed the midline with additional Vitoss BA with bone marrow aspirate infuse, and then placed a second implant on the left side again retracting the thecal sac and nerve root medially.  Again it was gradually expanded in an identical fashion to the right-sided implant.  We then packed the lateral gutter over the transverse processes and intertransverse space with Vitoss BA with bone marrow aspirate and infuse. We then selected pre-lordosed 35 mm rods, they were placed within the screw heads and secured with locking caps once all 4 locking caps were placed final tightening was performed against a counter torque.  The wound had been irrigated multiple times during the procedure with saline solution and bacitracin solution, good hemostasis was established with a combination of bipolar cautery, Surgifoam, and Gelfoam with thrombin. Once good hemostasis was confirmed we proceeded with closure paraspinal muscles deep fascia and Scarpa's fascia were closed with interrupted undyed 1 Vicryl sutures the subcutaneous and subcuticular closed with interrupted inverted 2-0 undyed Vicryl sutures the skin edges were approximated with Dermabond.  The wound was dressed with sterile gauze and Hypafix.  Following surgery the patient was turned back to the supine position to be reversed and the anesthetic extubated and transferred to the recovery room for further care.   PLAN OF CARE: Admit to inpatient   PATIENT  DISPOSITION:  PACU - hemodynamically stable.   Delay start of Pharmacological VTE agent (>24hrs) due to surgical blood loss or risk of bleeding:  yes

## 2018-02-02 MED ORDER — HYDROCODONE-ACETAMINOPHEN 5-325 MG PO TABS: 1.0000 | ORAL_TABLET | ORAL | 0 refills | Status: AC | PRN

## 2018-02-02 MED FILL — Thrombin (Recombinant) For Soln 20000 Unit: CUTANEOUS | Qty: 1 | Status: AC

## 2018-02-02 NOTE — Progress Notes (Signed)
Patient alert and oriented, mae's well, voiding adequate amount of urine, swallowing without difficulty, no c/o pain at time of discharge. Patient discharged home with family. Script and discharged instructions given to patient. Patient and family stated understanding of instructions given. Patient has an appointment with Dr. Nudelman 

## 2018-02-02 NOTE — Discharge Instructions (Signed)
°  Call Your Doctor If Any of These Occur °Redness, drainage, or swelling at the wound.  °Temperature greater than 101 degrees. °Severe pain not relieved by pain medication. °Incision starts to come apart. °Follow Up Appt °Call today for appointment in 3 weeks ([phone]) or for problems.  If you have any hardware placed in your spine, you will need an x-ray before your appointment. °

## 2018-02-02 NOTE — Discharge Summary (Signed)
Physician Discharge Summary  Patient ID: Megan Fox MRN: [id number] DOB/AGE: [birth year] 52 y.o.  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  Postoperatively she has done well, with excellent relief of her neurogenic claudication.  She is up and ambulating actively.  Her incision is healing nicely.  She and her husband have been given instructions regarding wound care and activities following discharge.  She is scheduled for return follow-up with me in the office in 3 weeks.  Discharge Exam: Blood pressure 101/64, pulse (!) 53, temperature 98.2 F (36.8 C), temperature source Oral, resp. rate 18, height 5\' 3"  (1.6 m), weight 64.6 kg, SpO2 98 %.  Disposition: Discharge disposition: 01-Home or Self Care       Discharge Instructions    Discharge wound care:   Complete by:  As directed    Leave the wound open to air. Shower daily with the wound uncovered. Water and soapy water should run over the incision area. Do not wash directly on the incision for 2 weeks. Remove the glue after 2 weeks.   Driving Restrictions   Complete by:  As directed    No driving for 2 weeks. May ride in the car locally now. May begin to drive locally in 2 weeks.    Other Restrictions   Complete by:  As directed    Walk gradually increasing distances out in the fresh air at least twice a day. Walking additional 6 times inside the house, gradually increasing distances, daily. No bending, lifting, or twisting. Perform activities between shoulder and waist height (that is at counter height when standing or table height when sitting).     Allergies as of 02/02/2018   No Known Allergies     Medication List    TAKE these medications   Biotin Plus Keratin 10000-100 MCG-MG Tabs Take 1 tablet by mouth daily.   BLINK TEARS 0.25 % Gel Generic drug:  Polyethylene Glycol 400 Place 1 drop into both eyes at bedtime.   FISH OIL PO Take 1 capsule by mouth daily.   Glucosamine Chondroitin Triple Tabs Take 1 tablet by mouth 2 (two) times daily.   GRAPE SEED PO Take 1 capsule by mouth 2 (two) times daily.   HYDROcodone-acetaminophen 5-325 MG tablet Commonly known as:  NORCO/VICODIN Take 1-2 tablets by mouth every 4 (four) hours as needed (pain).   levothyroxine 50 MCG tablet Commonly known as:  SYNTHROID, LEVOTHROID Take 50 mcg by mouth daily before breakfast.   loratadine 10 MG tablet Commonly known as:  CLARITIN Take 10 mg by mouth daily.   multivitamin with minerals Tabs tablet Take 1 tablet by mouth daily.   naproxen 500 MG tablet Commonly known as:  NAPROSYN Take 500 mg by mouth 2 (two) times daily.   niacin 500 MG tablet Take 500 mg by mouth daily.   PARoxetine 20 MG tablet Commonly known as:  PAXIL Take 20 mg by mouth every evening.  TURMERIC PO Take 1 tablet by mouth 2 (two) times daily.            Discharge Care Instructions  (From admission, onward)         Start     Ordered   02/02/18 0000  Discharge wound care:    Comments:  Leave the wound open to air. Shower daily with the wound uncovered. Water and soapy water should run over the incision area. Do not wash directly on the incision for 2 weeks. Remove the glue  after 2 weeks.   02/02/18 1125           Signed: Hewitt ShortsNUDELMAN,Jarrett Chicoine W 02/02/2018, 11:25 AM

## 2018-02-03 MED FILL — Heparin Sodium (Porcine) Inj 1000 Unit/ML: INTRAMUSCULAR | Qty: 30 | Status: AC

## 2018-02-03 MED FILL — Sodium Chloride IV Soln 0.9%: INTRAVENOUS | Qty: 1000 | Status: AC

## 2018-02-04 ENCOUNTER — Other Ambulatory Visit: Payer: Self-pay | Admitting: Neurosurgery

## 2019-04-11 IMAGING — RF DG LUMBAR SPINE 2-3V
1 series · 2 of 2 positions shown · non-contrast
Comparison: 02/01/2018

CLINICAL DATA: L3-4 decompression.

EXAM:
DG C-ARM 61-120 MIN; LUMBAR SPINE - 2-3 VIEW

[Series 1: run · 2 of 2 slices shown]
[im 1/2]
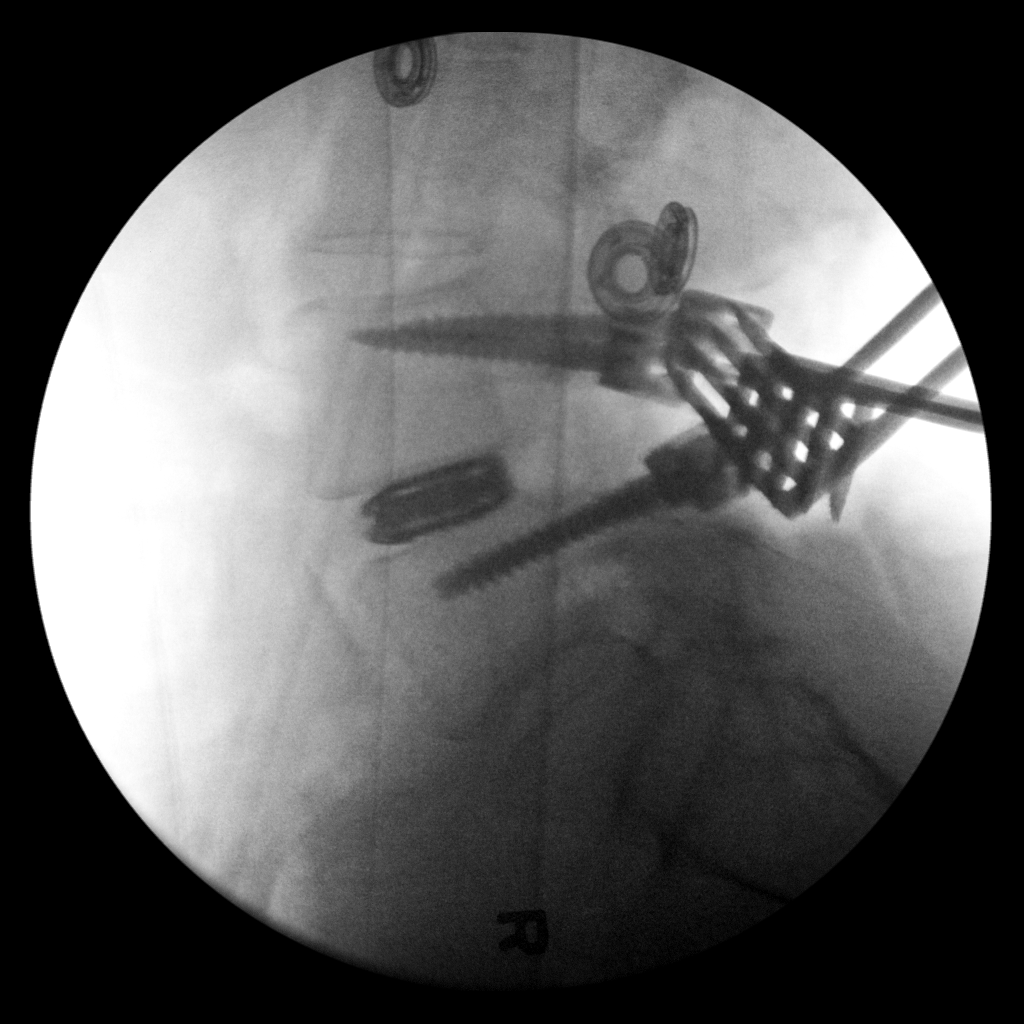
[im 2/2]
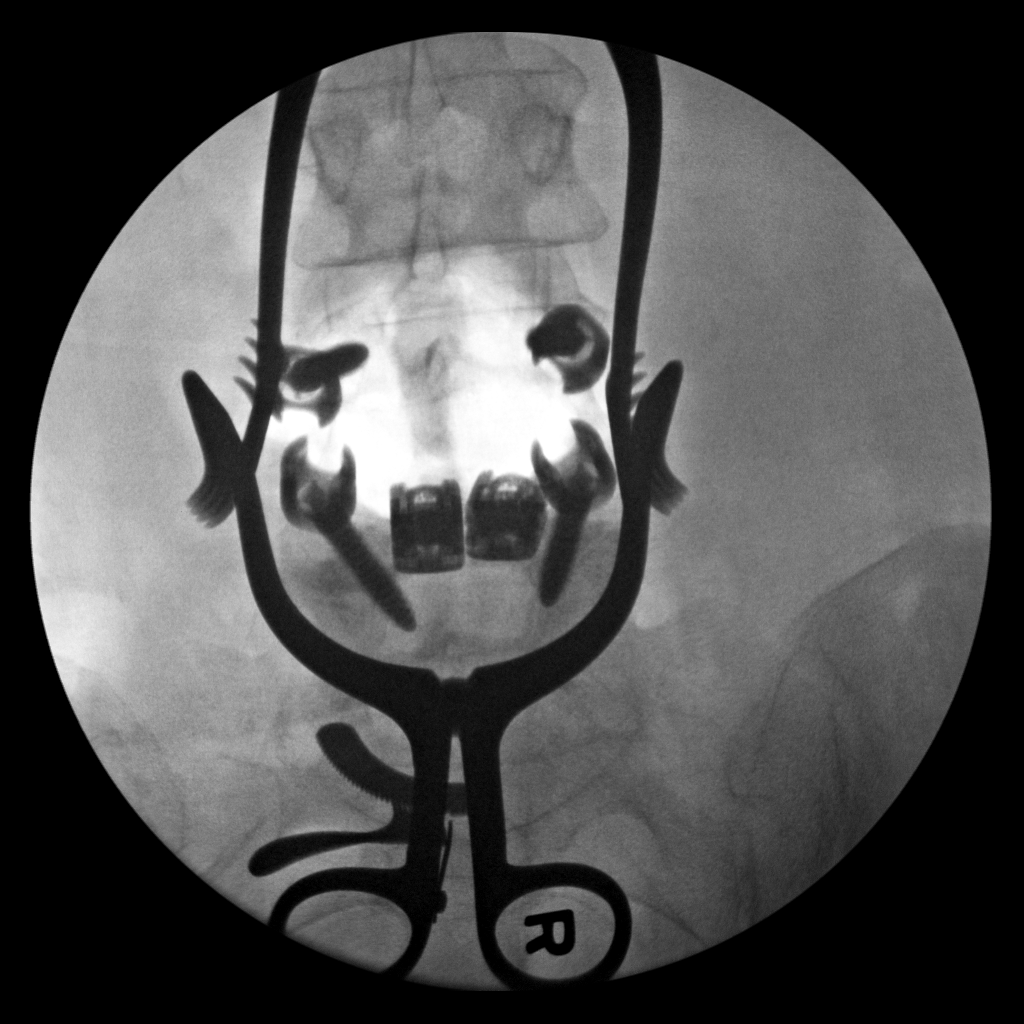

[2 of 2 positions shown; findings below may reference images not displayed]

FINDINGS: Fluoroscopy for PLIF. Unremarkable hardware positioning with no
evidence of fracture. There is transitional anatomy on preceding
localization radiograph and 01/15/2018 scoliosis series. Reference
numbering discectomy and fusion scheme
IMPRESSION: Fluoroscopy for PLIF.  No unexpected finding.
